# Patient Record
Sex: Male | Born: 1980 | Race: Black or African American | Hispanic: No | Marital: Single | Smoking: Light tobacco smoker
Health system: Southern US, Community
[De-identification: ages and names within clinical notes are randomized; demographics above are authoritative.]

---

## 2013-06-23 ENCOUNTER — Emergency Department (HOSPITAL_COMMUNITY): Payer: Non-veteran care

## 2013-06-23 ENCOUNTER — Encounter (HOSPITAL_COMMUNITY): Payer: Self-pay | Admitting: Emergency Medicine

## 2013-06-23 ENCOUNTER — Emergency Department (HOSPITAL_COMMUNITY)
Admission: EM | Admit: 2013-06-23 | Discharge: 2013-06-23 | Disposition: A | Discharge status: Home / Self Care | Payer: Non-veteran care | Attending: Emergency Medicine | Admitting: Emergency Medicine

## 2013-06-23 DIAGNOSIS — R52 Pain, unspecified: Secondary | ICD-10-CM

## 2013-06-23 DIAGNOSIS — R059 Cough, unspecified: Secondary | ICD-10-CM

## 2013-06-23 DIAGNOSIS — J029 Acute pharyngitis, unspecified: Secondary | ICD-10-CM | POA: Insufficient documentation

## 2013-06-23 DIAGNOSIS — R05 Cough: Secondary | ICD-10-CM

## 2013-06-23 DIAGNOSIS — R109 Unspecified abdominal pain: Secondary | ICD-10-CM | POA: Insufficient documentation

## 2013-06-23 DIAGNOSIS — F17200 Nicotine dependence, unspecified, uncomplicated: Secondary | ICD-10-CM | POA: Insufficient documentation

## 2013-06-23 DIAGNOSIS — J111 Influenza due to unidentified influenza virus with other respiratory manifestations: Secondary | ICD-10-CM | POA: Insufficient documentation

## 2013-06-23 MED ORDER — OSELTAMIVIR PHOSPHATE 75 MG PO CAPS: 75.0000 mg | ORAL_CAPSULE | Freq: Two times a day (BID) | ORAL | Status: DC

## 2013-06-23 NOTE — Discharge Instructions (Signed)
Take tamiflu as prescribed. Rest, drink plenty of fluids. Your chest xray was read by our radiologist as showing:        CLINICAL DATA: Congestion.  EXAM:  CHEST 2 VIEW  COMPARISON: None.  FINDINGS:  Minimal pleural parenchymal thickening in the anterior aspect of the  chest seen on lateral view only. This may be secondary to a  prominent epicardial fat pad, however mild infiltrate cannot be  excluded and follow-up PA and lateral chest x-ray suggested. Heart  size is normal. Pulmonary vascularity is normal. No pleural effusion  or pneumothorax. No acute osseous abnormality.  IMPRESSION:  Small ill-defined density anterior portion of the lower chest, seen  on lateral view only. This may represent prominent epicardial fat  pad however mild infiltrate and/or atelectasis cannot be excluded  and follow-up PA and lateral chest x-ray suggested.     Regarding chest xray result above, you must follow up with primary care doctor in 1 month - discuss the above chest xray result, and have them repeat a chest xray then to further assess.  If the 'small-ill defined density' noted on todays chest xray has resolved, no further evaluation will be needed.  If that area was noted to persist/enlarge, then discuss further imaging with your primary care doctor.   Return to ER if worse, new symptoms, trouble breathing, recurrent or persistent coughing up of blood, weak/faint, other concern.    Influenza, Adult Influenza ("the flu") is a viral infection of the respiratory tract. It occurs more often in winter months because people spend more time in close contact with one another. Influenza can make you feel very sick. Influenza easily spreads from person to person (contagious). CAUSES  Influenza is caused by a virus that infects the respiratory tract. You can catch the virus by breathing in droplets from an infected person's cough or sneeze. You can also catch the virus by touching something that was  recently contaminated with the virus and then touching your mouth, nose, or eyes. SYMPTOMS  Symptoms typically last 4 to 10 days and may include:  Fever.  Chills.  Headache, body aches, and muscle aches.  Sore throat.  Chest discomfort and cough.  Poor appetite.  Weakness or feeling tired.  Dizziness.  Nausea or vomiting. DIAGNOSIS  Diagnosis of influenza is often made based on your history and a physical exam. A nose or throat swab test can be done to confirm the diagnosis. RISKS AND COMPLICATIONS You may be at risk for a more severe case of influenza if you smoke cigarettes, have diabetes, have chronic heart disease (such as heart failure) or lung disease (such as asthma), or if you have a weakened immune system. Elderly people and pregnant women are also at risk for more serious infections. The most common complication of influenza is a lung infection (pneumonia). Sometimes, this complication can require emergency medical care and may be life-threatening. PREVENTION  An annual influenza vaccination (flu shot) is the best way to avoid getting influenza. An annual flu shot is now routinely recommended for all adults in the [place] TREATMENT  In mild cases, influenza goes away on its own. Treatment is directed at relieving symptoms. For more severe cases, your caregiver may prescribe antiviral medicines to shorten the sickness. Antibiotic medicines are not effective, because the infection is caused by a virus, not by bacteria. HOME CARE INSTRUCTIONS  Only take over-the-counter or prescription medicines for pain, discomfort, or fever as directed by your caregiver.  Use a cool mist humidifier  to make breathing easier.  Get plenty of rest until your temperature returns to normal. This usually takes 3 to 4 days.  Drink enough fluids to keep your urine clear or pale yellow.  Cover your mouth and nose when coughing or sneezing, and wash your hands well to avoid spreading the  virus.  Stay home from work or school until your fever has been gone for at least 1 full day. SEEK MEDICAL CARE IF:   You have chest pain or a deep cough that worsens or produces more mucus.  You have nausea, vomiting, or diarrhea. SEEK IMMEDIATE MEDICAL CARE IF:   You have difficulty breathing, shortness of breath, or your skin or nails turn bluish.  You have severe neck pain or stiffness.  You have a severe headache, facial pain, or earache.  You have a worsening or recurring fever.  You have nausea or vomiting that cannot be controlled. MAKE SURE YOU:  Understand these instructions.  Will watch your condition.  Will get help right away if you are not doing well or get worse. Document Released: 05/09/2000 Document Revised: 11/11/2011 Document Reviewed: 08/11/2011 [facility] Patient Information 2014 [place], [place].   Smoking Hazards Smoking cigarettes is extremely bad for your health. Tobacco smoke has over 200 known poisons in it. It contains the poisonous gases nitrogen oxide and carbon monoxide. There are over 60 chemicals in tobacco smoke that cause cancer. Some of the chemicals found in cigarette smoke include:   Cyanide.   Benzene.   Formaldehyde.   Methanol (wood alcohol).   Acetylene (fuel used in welding torches).   Ammonia.  Even smoking lightly shortens your life expectancy by several years. You can greatly reduce the risk of medical problems for you and your family by stopping now. Smoking is the most preventable cause of death and disease in our society. Within days of quitting smoking, your circulation improves, you decrease the risk of having a heart attack, and your lung capacity improves. There may be some increased phlegm in the first few days after quitting, and it may take months for your lungs to clear up completely. Quitting for 10 years reduces your risk of developing lung cancer to almost that of a nonsmoker.  WHAT ARE THE RISKS OF  SMOKING? Cigarette smokers have an increased risk of many serious medical problems, including:  Lung cancer.   Lung disease (such as pneumonia, bronchitis, and emphysema).   Heart attack and chest pain due to the heart not getting enough oxygen (angina).   Heart disease and peripheral blood vessel disease.   Hypertension.   Stroke.   Oral cancer (cancer of the lip, mouth, or voice box).   Bladder cancer.   Pancreatic cancer.   Cervical cancer.   Pregnancy complications, including premature birth.   Stillbirths and smaller newborn babies, birth defects, and genetic damage to sperm.   Early menopause.   Lower estrogen level for women.   Infertility.   Facial wrinkles.   Blindness.   Increased risk of broken bones (fractures).   Senile dementia.   Stomach ulcers and internal bleeding.   Delayed wound healing and increased risk of complications during surgery. Because of secondhand smoke exposure, children of smokers have an increased risk of the following:   Sudden infant death syndrome (SIDS).   Respiratory infections.   Lung cancer.   Heart disease.   Ear infections.  WHY IS SMOKING ADDICTIVE? Nicotine is the chemical agent in tobacco that is capable of causing addiction or dependence.  When you smoke and inhale, nicotine is absorbed rapidly into the bloodstream through your lungs. Both inhaled and noninhaled nicotine may be addictive.  WHAT ARE THE BENEFITS OF QUITTING?  There are many health benefits to quitting smoking. Some are:   The likelihood of developing cancer and heart disease decreases. Health improvements are seen almost immediately.   Blood pressure, pulse rate, and breathing patterns start returning to normal soon after quitting.   People who quit may see an improvement in their overall quality of life.  HOW DO YOU QUIT SMOKING? Smoking is an addiction with both physical and psychological effects, and longtime  habits can be hard to change. Your health care provider can recommend:  Programs and community resources, which may include group support, education, or therapy.  Replacement products, such as patches, gum, and nasal sprays. Use these products only as directed. Do not replace cigarette smoking with electronic cigarettes (commonly called e-cigarettes). The safety of e-cigarettes is unknown, and some may contain harmful chemicals. FOR MORE INFORMATION  American Lung Association: www.lung.org  American Cancer Society: www.cancer.org Document Released: 06/19/2004 Document Revised: 03/02/2013 Document Reviewed: 11/01/2012 [facility] Patient Information 2014 [place].       Emergency Department Resource Guide 1) Find a Doctor and Pay Out of Pocket Although you won't have to find out who is covered by your insurance plan, it is a good idea to ask around and get recommendations. You will then need to call the office and see if the doctor you have chosen will accept you as a new patient and what types of options they offer for patients who are self-pay. Some doctors offer discounts or will set up payment plans for their patients who do not have insurance, but you will need to ask so you aren't surprised when you get to your appointment.  2) Contact Your Local Health Department Not all health departments have doctors that can see patients for sick visits, but many do, so it is worth a call to see if yours does. If you don't know where your local health department is, you can check in your phone book. The CDC also has a tool to help you locate your state's health department, and many state websites also have listings of all of their local health departments.  3) Find a Walk-in Clinic If your illness is not likely to be very severe or complicated, you may want to try a walk in clinic. These are popping up all over the country in pharmacies, drugstores, and shopping centers. They're usually staffed  by nurse practitioners or physician assistants that have been trained to treat common illnesses and complaints. They're usually fairly quick and inexpensive. However, if you have serious medical issues or chronic medical problems, these are probably not your best option.  No Primary Care Doctor: - Call Health Connect at  [phone] - they can help you locate a primary care doctor that  accepts your insurance, provides certain services, etc. - Physician Referral Service- [phone]  Chronic Pain Problems: Organization         Address  Phone   Notes  [facility]  [phone] Patients need to be referred by their primary care doctor.   Medication Assistance: Organization         Address  Phone   Notes  [facility] Medication [facility] [address] [place], [place] [phone] --Must be a resident of [facility] -- Must have NO insurance coverage  whatsoever (no Medicaid/ Medicare, etc.) -- The pt. MUST have a primary care doctor that directs their care regularly and follows them in the community   [facility]  [phone]   [employer]  [phone]    Agencies that provide inexpensive medical care: Organization         Address  Phone   Notes  [facility]  [phone]   [facility]    [phone]   [facility] [address] [place], [place] [address] [phone]   [facility] [place] [address] [place], [place] [phone]   Planned Parenthood    [phone]   [facility]    [phone]   [facility]  [address], [place] Phone:  [phone], Fax:  [phone] Hours of Operation:  9 am - 6 pm, M-F.  Also accepts Medicaid/Medicare and self-pay.  [facility]  [address], [place] Phone: [phone], Fax: [phone]. Hours of Operation:   8:30 am - 5:30 pm, M-F.  Also accepts Medicaid and self-pay.  [facility] [place] [address] [place] Phone: [phone]   [facility] [address] Natasha Bence [place], [place] [phone], Ext. [phone] Mondays & Thursdays: 7-9 AM.  First 15 patients are seen on a first come, first serve basis.    Medicaid-accepting [facility] Providers:  Organization         Address  Phone   Notes  [facility] [address], [place] [phone] Also accepts self-pay patients.  [facility] [address] Laurell Josephs [place], [place]  [phone]   [facility] [address], [place] [phone]   [facility] [address], [place] [phone]   Renaye Rakers [address], [place]   [phone] Only accepts [place] Access [place] patients after they have their name applied to their card.   Self-Pay (no insurance) in [facility]:  Organization         Address  Phone   Notes  Sickle Cell Patients, [facility] Internal Medicine [address] [place], [place] [phone]   [facility] [address] [place], [place] [phone]   [facility]  [address], [place] [phone]   [facility]/Dr. Osei-Bonsu  [address] [place] or [address], [place] [phone] Phone number for both [place] and [place] locations is the same.  [facility] [address] [place] [phone]   [facility] [address] [place] or [address] [phone]   [facility] [address] [place] [phone], phone; [phone], fax Sees patients 1st and 3rd Saturday of every month.  Must not qualify for public or private insurance (i.e. Medicaid, Medicare, [place] Health  Choice, Veterans' Benefits)  Household income should be no more than 200% of the poverty level The clinic cannot treat you if you are pregnant or think you are pregnant  Sexually transmitted diseases are not treated at the clinic.    Dental Care: Organization         Address  Phone  Notes  [facility] Department of  Public [facility] [address] [place], [place] [phone] Accepts children up to age 17 who are enrolled in [place] or [place] Health Choice; pregnant women with a Medicaid card; and children who have applied for Medicaid or [place] Health Choice, but were declined, whose parents can pay a reduced fee at time of service.  [facility] Department of [facility]  [address], [place] [phone] Accepts children up to age 44 who are enrolled in [place] or [place] Health Choice; pregnant women with a Medicaid card; and children who have applied for Medicaid or [place] Health Choice, but were declined, whose parents can pay a reduced fee at time of service.  [facility]  [address] [place], [place] [phone] Patients are seen by appointment only. Walk-ins are not accepted. [facility] will see patients 41 years of age and older. Monday - Tuesday (8am-5pm) Most Wednesdays (8:30-5pm) $30 per visit, cash only  [facility] Adult Dental Access PROGRAM  [address], [facility] [phone] Patients are seen by appointment only. Walk-ins are not accepted. [facility] will see patients 20 years of age and older. One Wednesday Evening (Monthly: Volunteer Based).  $30 per visit, cash only  [employer]  [phone] for adults; Children under age 65, call Graduate Pediatric Dentistry at [phone]. Children aged 94-14, please call [phone] to request a pediatric application.  Dental services are provided in all areas of dental care including fillings, crowns and bridges, complete  and partial dentures, implants, gum treatment, root canals, and extractions. Preventive care is also provided. Treatment is provided to both adults and children. Patients are selected via a lottery and there is often a waiting list.   [facility] [address], [place]  [phone] [email or web address]   [facility] [address] [place], [place] [phone], Ext. [phone] Second and Fourth Thursday of each month, opens at 6:30 AM; Clinic ends at 9 AM.  Patients are seen on a first-come first-served basis, and a limited number are seen during each clinic.   [facility]  [address] Ether Griffins [place], [place] [phone]   Eligibility Requirements You must have lived in [place], [place], or [place] counties for at least the last three months.   You cannot be eligible for state or federal sponsored [place], including [employer], [place], or [employer].   You generally cannot be eligible for healthcare insurance through your employer.    How to apply: Eligibility screenings are held every Tuesday and Wednesday afternoon from 1:00 pm until 4:00 pm. You do not need an appointment for the interview!  [facility] [address], [place], [place] [phone]   [facility] Health Department  [phone]   [facility] Health Department  [phone]   [facility] Health Department  [phone]    Behavioral Health Resources in the Community: Intensive Outpatient Programs Organization         Address  Phone  Notes  [facility] [address] [place], [place] [phone]   [facility] Outpatient [address] [place], [place] [phone]   [facility] [address] [place], [place]  [phone]   [facility] Mental Health [address],  [place], [place] [phone] or [phone]   Substance Abuse Resources Organization          Address  Phone  Notes  [facility]  [phone]   [facility]  [phone]   [facility] [place]  [phone]   Floydene Flock  [phone]   Residential & Outpatient Substance Abuse Program  [phone]   Psychological Services Organization         Address  Phone  Notes  [facility] Health  [phone]   [facility] Services  [phone]   [facility] [address], [place] [phone] or [phone]    Mobile Crisis Teams Organization         Address  Phone  Notes  [facility], Mobile Crisis Care Unit  [phone]   [facility]  [address] [place], [place] [phone]   Doristine Locks [address] [place] [phone]    Self-Help/Support Groups Organization         Address  Phone             Notes  [facility] [place] - variety of support groups  336- I7437963 Call for more information  Narcotics Anonymous (NA), [facility] [address], [employer] [place]  2 meetings at this location   Statistician         Address  Phone  Notes  [facility] [address],    [place]  [phone]   [facility]  [address], [place] [address], [place], [place] [phone]   [facility] [address] [place], [place] [phone] Admissions: 8am-3pm M-F  [facility] [address],    [place], [place] [phone]   [facility] [address] [place], [place], [place] [phone]   [facility] [address],  [place], [place] [phone]   [facility] - Intensive Outpatient [address] Laurell Josephs [address], [place], [place] [phone]   [facility] (Addiction [facility] [address] [place].,  [place], [place] [phone] or [phone]   [facility] ([facility]) [address], [place], [place]  [phone] Accepts Medicaid  [facility] [address],  [place] [phone] Substance Abuse/Addiction Treatment   [facility] Organization         Address  Phone  Notes  [facility]  [phone]   Angie Fava, PhD [address] Ervin Knack [place], [place]   [phone] or [phone]   [facility] Behavioral   [address] [place], [place] [phone]   [facility] [address], [place], [place] [phone] Insurance/Medicaid/sponsorship through [facility] [address]                                    [place], [place] [phone] Therapy/tele-psych/case  [facility] [address], [place] [phone]    Dr. Lolly Mustache  [phone]   [facility]  [employer] [facility] Dept. 1) [address] [place] 2) [address], [place] 3)  [address], [place] [phone] [phone]  [phone]   [facility] Child Abuse Hotline 313-334-8174 or [phone] (After Hours)

## 2013-06-23 NOTE — ED Provider Notes (Signed)
CSN: [id number]     Arrival date & time 06/23/13  2706 History   First MD Initiated Contact with Patient 06/23/13 0845     Chief Complaint  Patient presents with  . coughing up scant blood    . Abdominal Pain  . Generalized Body Aches   (Consider location/radiation/quality/duration/timing/severity/associated sxs/prior Treatment) The history is provided by the patient.  pt c/o episodic cough, nasal congestion, sore throat, body aches, slight headache, for the past 1-2 days. subj fever. No chills/sweats. States cough generally non productive, although did note that he coughed up small amt blood tinged sputum. No trouble breathing or swallowing. No hx asthma. occ cigar smoker. No wt loss. States sore all over incl abdomen. No focal abd pain. Normal appetite. No vomiting or diarrhea. No gu c/o. No rash. States recent ill contact w signif other w 'flu'.      History reviewed. No pertinent past medical history. History reviewed. No pertinent past surgical history. History reviewed. No pertinent family history. History  Substance Use Topics  . Smoking status: Light Tobacco Smoker    Types: Cigars  . Smokeless tobacco: Not on file  . Alcohol Use: Yes     Comment: 1x/ week    Review of Systems  Constitutional: Positive for fever.  HENT: Positive for congestion and sore throat. Negative for trouble swallowing and voice change.   Eyes: Negative for redness.  Respiratory: Positive for cough. Negative for shortness of breath.   Cardiovascular: Negative for chest pain and leg swelling.  Gastrointestinal: Negative for vomiting, abdominal pain and diarrhea.  Genitourinary: Negative for flank pain.  Musculoskeletal: Negative for back pain and neck pain.  Skin: Negative for rash.  Neurological: Negative for light-headedness.  Hematological: Does not bruise/bleed easily.  Psychiatric/Behavioral: Negative for confusion.    Allergies  Review of patient's allergies indicates no known  allergies.  Home Medications   Current Outpatient Rx  Name  Route  Sig  Dispense  Refill  . Pseudoephedrine-APAP-DM (TYLENOL COLD/FLU SEVERE DAY PO)   Oral   Take 2 capsules by mouth every 4 (four) hours as needed (cold).          BP 130/73  Pulse 68  Temp(Src) 98.5 F (36.9 C) (Oral)  Resp 16  SpO2 100% Physical Exam  Nursing note and vitals reviewed. Constitutional: He is oriented to person, place, and time. He appears well-developed and well-nourished. No distress.  HENT:  Mouth/Throat: Oropharynx is clear and moist.  Nasal congestion  Eyes: Conjunctivae are normal. Pupils are equal, round, and reactive to light. No scleral icterus.  Neck: Neck supple. No tracheal deviation present.  No stiffness or rigidity  Cardiovascular: Normal rate, regular rhythm, normal heart sounds and intact distal pulses.  Exam reveals no gallop and no friction rub.   No murmur heard. Pulmonary/Chest: Effort normal and breath sounds normal. No accessory muscle usage. No respiratory distress.  Abdominal: Soft. Bowel sounds are normal. He exhibits no distension and no mass. There is no tenderness. There is no rebound and no guarding.  Genitourinary:  No cva tenderness  Musculoskeletal: Normal range of motion. He exhibits no edema and no tenderness.  Neurological: He is alert and oriented to person, place, and time.  Steady gait  Skin: Skin is warm and dry. No rash noted. He is not diaphoretic.  Psychiatric: He has a normal mood and affect.    ED Course  Procedures (including critical care time)  Dg Chest 2 View  06/23/2013   CLINICAL DATA:  Congestion.  EXAM: CHEST  2 VIEW  COMPARISON:  None.  FINDINGS: Minimal pleural parenchymal thickening in the anterior aspect of the chest seen on lateral view only. This may be secondary to a prominent epicardial fat pad, however mild infiltrate cannot be excluded and follow-up PA and lateral chest x-ray suggested. Heart size is normal. Pulmonary  vascularity is normal. No pleural effusion or pneumothorax. No acute osseous abnormality.  IMPRESSION: Small ill-defined density anterior portion of the lower chest, seen on lateral view only. This may represent prominent epicardial fat pad however mild infiltrate and/or atelectasis cannot be excluded and follow-up PA and lateral chest x-ray suggested.   Electronically Signed   By: Marcello Moores  Register   On: 06/23/2013 09:20      MDM  cxr neg acute for pna.  Reviewed nursing notes and prior charts for additional history.   pts symptoms and exam felt most c/w viral uri/flu.  rx tamiflu for home.  Pt appears stable for d/c.   Discussed xrays w pt incl 'small ill define opacity seen only on lat view' w pt.  Pt afeb, pulse ox 100%, constellation symptoms felt c/w viral process.  Discussed need follow up cxr w pt in 1 month, return to ED if worse, weak/faint, sob, recurrent coughing up of blood, other concern.        Mirna Mires, MD 06/23/13 [phone]

## 2013-06-23 NOTE — ED Notes (Signed)
Pt reports non productive dry cough x3 days, last two days pt has occasionally coughed up scant blood with saliva, reports upper abdominal pain 6/10 x2 days with nausea and body aches.

## 2014-07-26 ENCOUNTER — Ambulatory Visit (INDEPENDENT_AMBULATORY_CARE_PROVIDER_SITE_OTHER): Payer: Managed Care, Other (non HMO) | Admitting: Podiatry

## 2014-07-26 ENCOUNTER — Ambulatory Visit (INDEPENDENT_AMBULATORY_CARE_PROVIDER_SITE_OTHER): Payer: Managed Care, Other (non HMO)

## 2014-07-26 ENCOUNTER — Encounter: Payer: Self-pay | Admitting: Podiatry

## 2014-07-26 VITALS — BP 119/75 | HR 76 | Resp 16

## 2014-07-26 DIAGNOSIS — Q828 Other specified congenital malformations of skin: Secondary | ICD-10-CM | POA: Diagnosis not present

## 2014-07-26 DIAGNOSIS — D239 Other benign neoplasm of skin, unspecified: Secondary | ICD-10-CM | POA: Diagnosis not present

## 2014-07-26 DIAGNOSIS — M779 Enthesopathy, unspecified: Secondary | ICD-10-CM

## 2014-07-26 NOTE — Progress Notes (Signed)
   Subjective:    Patient ID: Connor Esparza, male    DOB: 1981/01/06, 34 y.o.   MRN: [id number]  HPI Comments: "I have a place on the side"  Patient c/o aching lateral 5th MPJ left for several months. There is a callused area. He usually trims it, has tried medicated disks-no help.     Review of Systems  Musculoskeletal: Positive for arthralgias.  All other systems reviewed and are negative.      Objective:   Physical Exam: I have reviewed his past mental history medications allergies surgery social history and review of systems area pulses are strongly palpable bilateral. Neurologic sensorium is intact her since once the monofilament. Deep tendon reflexes intact bilateral and muscle strength is 5 over 5 dorsiflexion flexors and inverters and everters. Orthopedic evaluation demonstrates all joints distal to the ankle for range of motion without crepitation. Cutaneous evaluation demonstrates pain on palpation of the fifth metatarsophalangeal joint of the left foot with an overlying reactive hyperkeratosis to the plantar lateral aspect of this area. The area is fluctuant on evaluation and does demonstrate a centralized porokeratotic lesion. Radiographic evaluation does not demonstrates any type of osseus abnormalities in this area consistent with porokeratosis.        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  Plan: Discussed etiology pathology conservative versus surgical therapies. I injected the capsule today with dexamethasone and local anesthetic after sterile Betadine skin prep. I also debrided the reactive hyperkeratosis mechanically today. At this point I then applied salicylic acid under occlusion to be left on for 3 days and not getting this wet. I will follow-up with him in 6 weeks at which time we will discuss our options. We also discussed appropriate shoe gear stretching exercises ice therapy shoe  modifications and making sure that he is not walking on the lateral seen of the sole.

## 2014-09-06 ENCOUNTER — Ambulatory Visit (INDEPENDENT_AMBULATORY_CARE_PROVIDER_SITE_OTHER): Payer: Managed Care, Other (non HMO) | Admitting: Podiatry

## 2014-09-06 VITALS — BP 121/74 | HR 82 | Resp 16

## 2014-09-06 DIAGNOSIS — Q828 Other specified congenital malformations of skin: Secondary | ICD-10-CM

## 2014-09-06 DIAGNOSIS — M779 Enthesopathy, unspecified: Secondary | ICD-10-CM | POA: Diagnosis not present

## 2014-09-06 NOTE — Progress Notes (Signed)
He presents today for follow-up of a soft tissue lesion to the plantar lateral aspect of the head of the fifth metatarsal left foot. He states that is greater than 50% improved.  Objective: Vital signs are stable he is alert and oriented 3. Pulses are strongly palpable. No erythema edema saline as drainage or odor. Porokeratotic lesion is noted to plantar lateral aspect of the fifth metatarsal head left foot. No signs of infection.  Assessment: Keratosis left foot.  Plan: Chemical destruction under occlusion to be left on for 3 days. He will then washed off the acid thoroughly and follow up with me in 6 weeks if necessary.

## 2014-10-18 ENCOUNTER — Ambulatory Visit: Payer: Managed Care, Other (non HMO) | Admitting: Podiatry

## 2014-10-30 ENCOUNTER — Ambulatory Visit: Payer: Managed Care, Other (non HMO) | Admitting: Podiatry

## 2014-11-01 ENCOUNTER — Ambulatory Visit: Payer: Managed Care, Other (non HMO) | Admitting: Podiatry

## 2014-11-08 ENCOUNTER — Encounter: Payer: Self-pay | Admitting: Podiatry

## 2014-11-08 ENCOUNTER — Ambulatory Visit (INDEPENDENT_AMBULATORY_CARE_PROVIDER_SITE_OTHER): Payer: Managed Care, Other (non HMO) | Admitting: Podiatry

## 2014-11-08 DIAGNOSIS — M779 Enthesopathy, unspecified: Secondary | ICD-10-CM | POA: Diagnosis not present

## 2014-11-08 DIAGNOSIS — Q828 Other specified congenital malformations of skin: Secondary | ICD-10-CM | POA: Diagnosis not present

## 2014-11-08 NOTE — Progress Notes (Signed)
He presents today for follow-up of a soft tissue lesion to the plantar lateral aspect of the head of the fifth metatarsal left foot.   Objective: Vital signs are stable he is alert and oriented 3. Pulses are strongly palpable. No erythema edema saline as drainage or odor. Porokeratotic lesion is noted to plantar lateral aspect of the fifth metatarsal head left foot. No signs of infection.  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  Plan:  I injected the capsule today with dexamethasone and local anesthetic after sterile Betadine skin prep. I also debrided the reactive hyperkeratosis mechanically today. At this point I then applied salicylic acid under occlusion to be left on for 3 days and not getting this wet. I will follow-up with him in 6 weeks

## 2014-12-20 ENCOUNTER — Ambulatory Visit (INDEPENDENT_AMBULATORY_CARE_PROVIDER_SITE_OTHER): Payer: Managed Care, Other (non HMO) | Admitting: Podiatry

## 2014-12-20 VITALS — BP 125/79 | HR 75 | Resp 16

## 2014-12-20 DIAGNOSIS — Q828 Other specified congenital malformations of skin: Secondary | ICD-10-CM | POA: Diagnosis not present

## 2014-12-20 DIAGNOSIS — B070 Plantar wart: Secondary | ICD-10-CM

## 2014-12-20 DIAGNOSIS — M779 Enthesopathy, unspecified: Secondary | ICD-10-CM | POA: Diagnosis not present

## 2014-12-20 NOTE — Progress Notes (Signed)
He presents today with a chief complaint of a painful fifth metatarsophalangeal joint of the left foot. He also states the small bump right ear really hurts when I walk. He denies any changes in his past mental history [place] he states that he is trying to discuss surgical intervention regarding this foot with his employer.  Objective: Vital signs are stable he is alert and oriented 3. pulses remain palpable left foot. Porokeratotic lesion plantar lateral aspect of the fifth metatarsophalangeal joint left foot. Pain on range of motion of the fifth metatarsophalangeal joint. Tailor's bunion deformities noted.  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  Plan: Injected the fifth metatarsophalangeal joint today with localized that it and dexamethasone. Debrided and enucleated the reactive hyperkeratotic lesion placed salicylic acid over the lesion to be left on for 3 days. We'll continue discussions with his workplace for surgical intervention.

## 2015-08-09 IMAGING — CR DG CHEST 2V
2 series · 2 of 2 positions shown · non-contrast
Comparison: None.

CLINICAL DATA: Congestion.

EXAM:
CHEST  2 VIEW

[w chest pa]
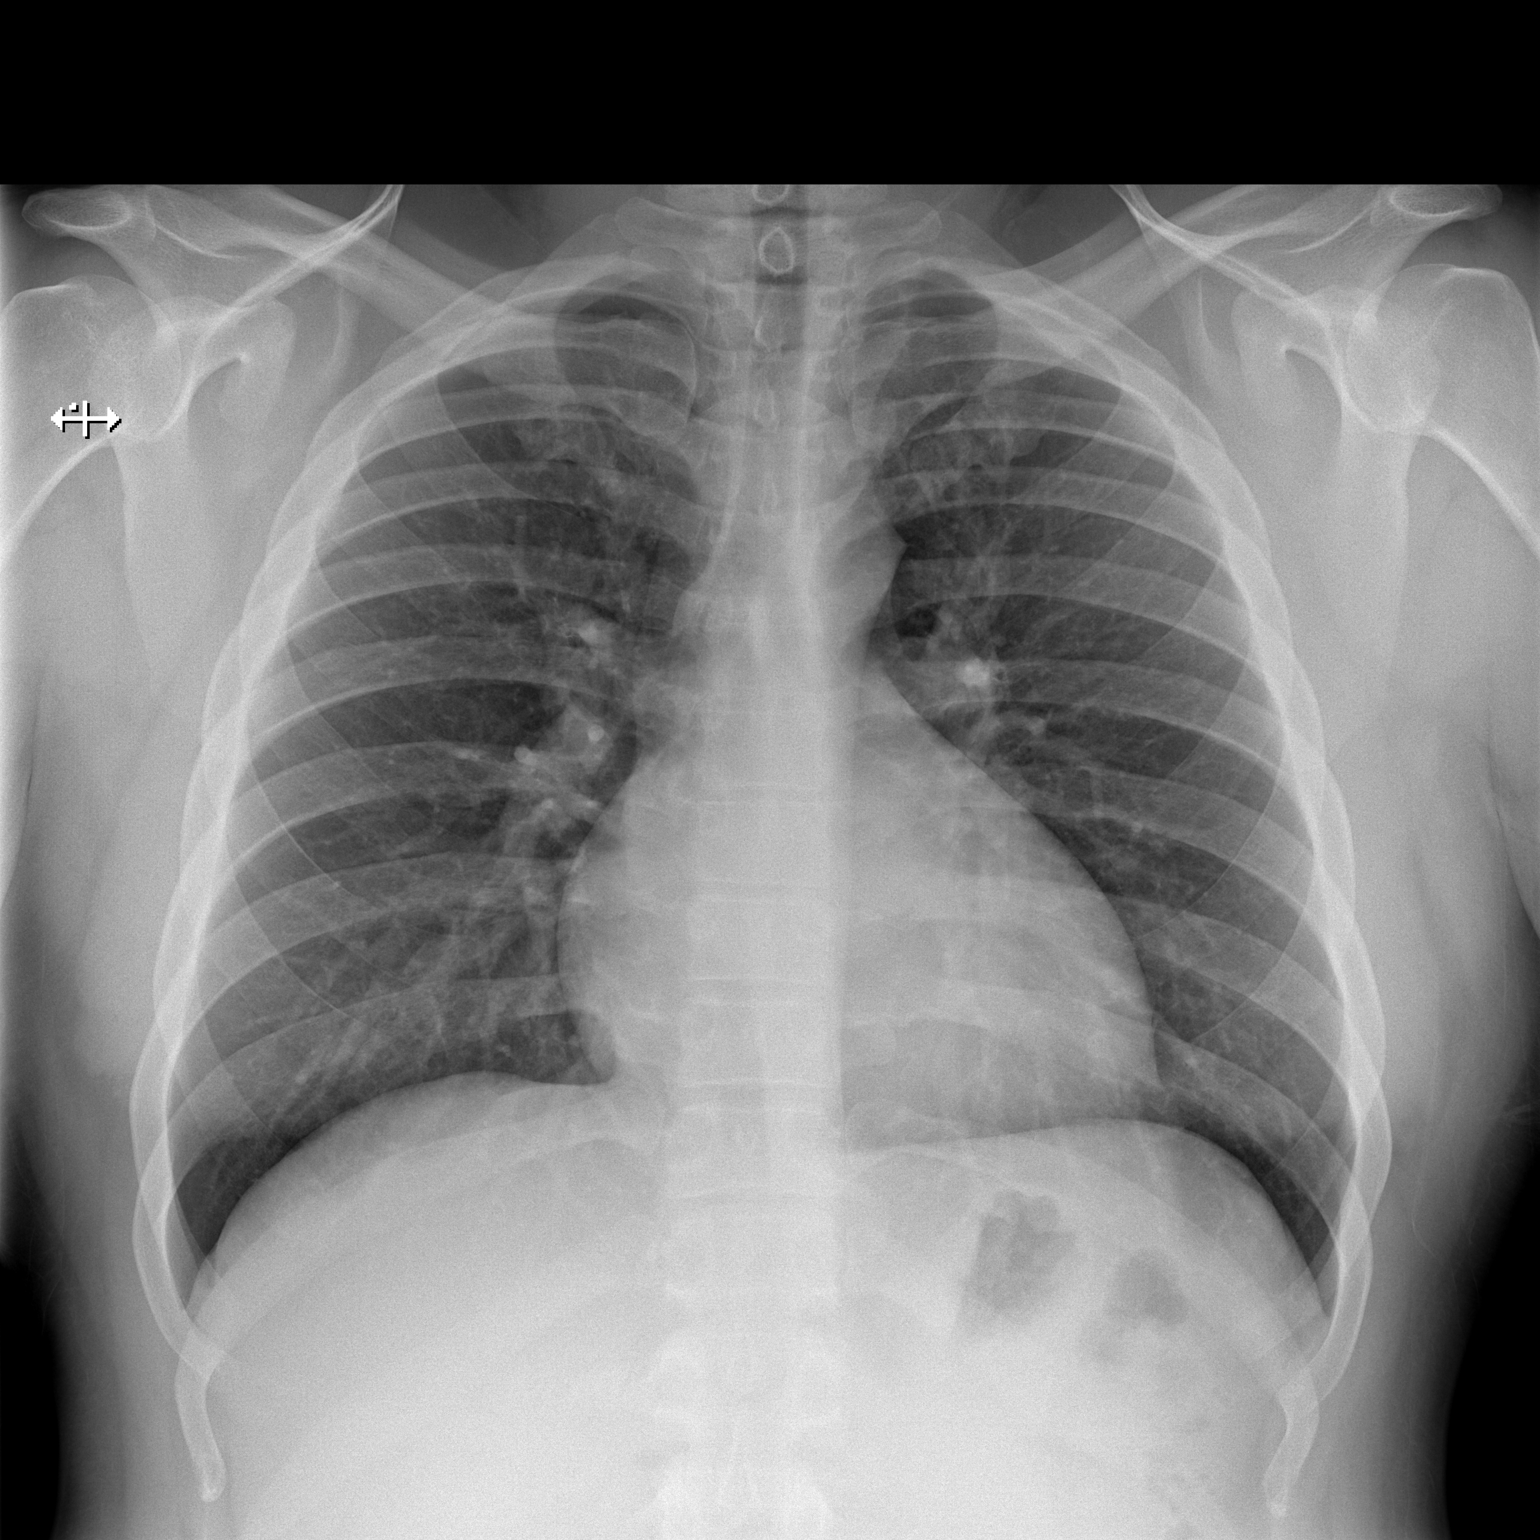

[w chest lat]
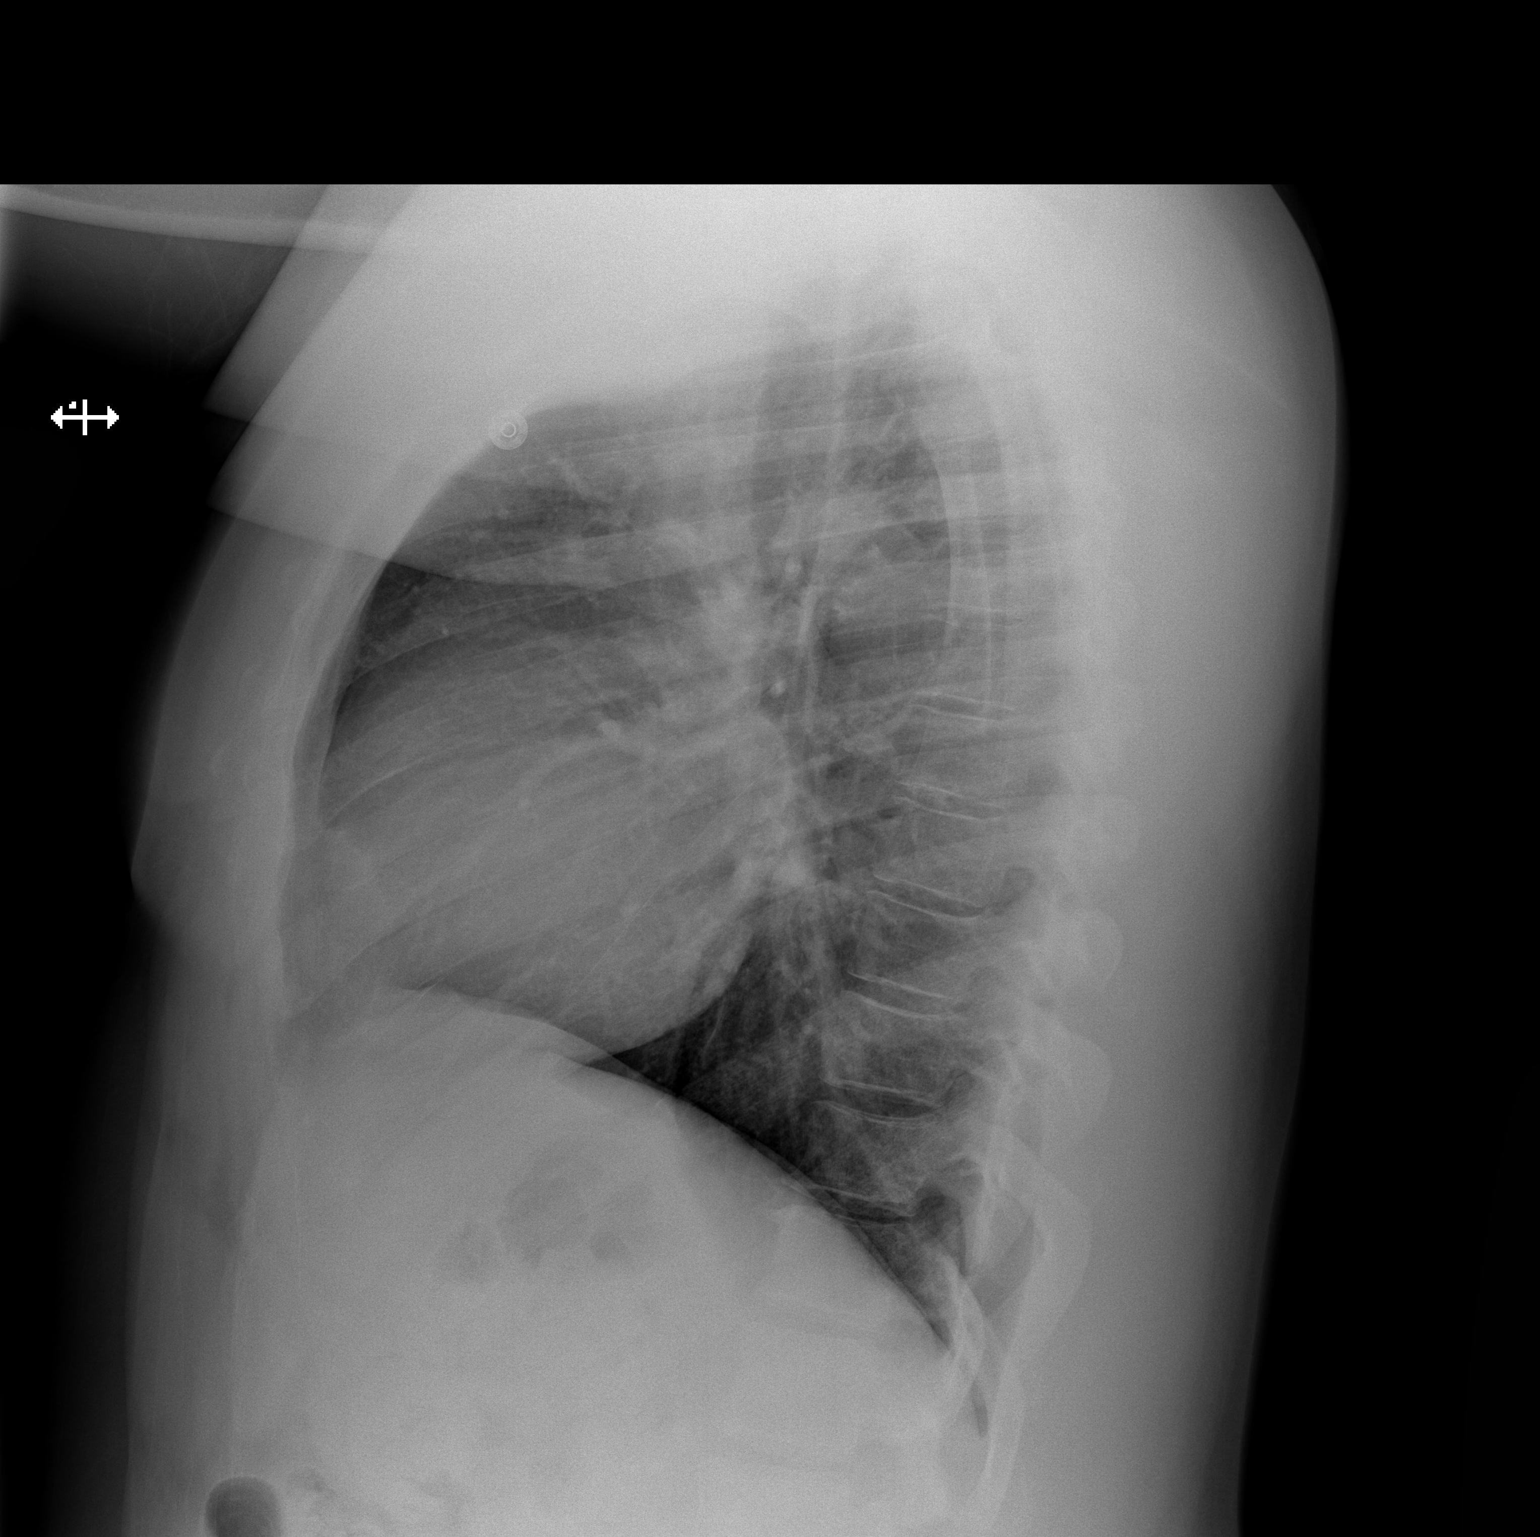

[2 of 2 positions shown; findings below may reference images not displayed]

FINDINGS: Minimal pleural parenchymal thickening in the anterior aspect of the
chest seen on lateral view only. This may be secondary to a
prominent epicardial fat pad, however mild infiltrate cannot be
excluded and follow-up PA and lateral chest x-ray suggested. Heart
size is normal. Pulmonary vascularity is normal. No pleural effusion
or pneumothorax. No acute osseous abnormality.
IMPRESSION: Small ill-defined density anterior portion of the lower chest, seen
on lateral view only. This may represent prominent epicardial fat
pad however mild infiltrate and/or atelectasis cannot be excluded
and follow-up PA and lateral chest x-ray suggested.
